# Patient Record
Sex: Male | Born: 1995 | Race: Black or African American | Hispanic: No | Marital: Single | Smoking: Current some day smoker
Health system: Southern US, Community
[De-identification: ages and names within clinical notes are randomized; demographics above are authoritative.]

## PROBLEM LIST (undated history)

## (undated) DIAGNOSIS — J302 Other seasonal allergic rhinitis: Secondary | ICD-10-CM

---
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
## 2015-12-29 ENCOUNTER — Encounter (HOSPITAL_COMMUNITY): Payer: Self-pay | Admitting: Emergency Medicine

## 2015-12-29 ENCOUNTER — Emergency Department (HOSPITAL_COMMUNITY)
Admission: EM | Admit: 2015-12-29 | Discharge: 2015-12-29 | Disposition: A | Discharge status: Home / Self Care | Payer: Medicaid Other | Attending: Emergency Medicine | Admitting: Emergency Medicine

## 2015-12-29 DIAGNOSIS — L509 Urticaria, unspecified: Secondary | ICD-10-CM | POA: Insufficient documentation

## 2015-12-29 DIAGNOSIS — F17200 Nicotine dependence, unspecified, uncomplicated: Secondary | ICD-10-CM | POA: Insufficient documentation

## 2015-12-29 NOTE — ED Notes (Signed)
Called pt in lobby without response 

## 2015-12-29 NOTE — ED Notes (Signed)
Pt. reports itchy skin hives onset this morning ay neck , arms and left knee , airway intact /respirations unlabored , no oral swelling .

## 2015-12-29 NOTE — ED Notes (Signed)
Called for room X3. No response.  

## 2015-12-30 ENCOUNTER — Encounter (HOSPITAL_COMMUNITY): Payer: Self-pay | Admitting: Emergency Medicine

## 2015-12-30 ENCOUNTER — Emergency Department (HOSPITAL_COMMUNITY)
Admission: EM | Admit: 2015-12-30 | Discharge: 2015-12-30 | Disposition: A | Discharge status: Home / Self Care | Payer: Medicaid Other | Attending: Emergency Medicine | Admitting: Emergency Medicine

## 2015-12-30 DIAGNOSIS — Z7952 Long term (current) use of systemic steroids: Secondary | ICD-10-CM | POA: Insufficient documentation

## 2015-12-30 DIAGNOSIS — Z79899 Other long term (current) drug therapy: Secondary | ICD-10-CM | POA: Insufficient documentation

## 2015-12-30 DIAGNOSIS — L03114 Cellulitis of left upper limb: Secondary | ICD-10-CM | POA: Insufficient documentation

## 2015-12-30 DIAGNOSIS — Z792 Long term (current) use of antibiotics: Secondary | ICD-10-CM | POA: Insufficient documentation

## 2015-12-30 DIAGNOSIS — F17200 Nicotine dependence, unspecified, uncomplicated: Secondary | ICD-10-CM | POA: Insufficient documentation

## 2015-12-30 DIAGNOSIS — Z7951 Long term (current) use of inhaled steroids: Secondary | ICD-10-CM | POA: Insufficient documentation

## 2015-12-30 MED ORDER — CEPHALEXIN 500 MG PO CAPS: 500.0000 mg | ORAL_CAPSULE | Freq: Four times a day (QID) | ORAL | Status: DC

## 2015-12-30 MED ORDER — SULFAMETHOXAZOLE-TRIMETHOPRIM 800-160 MG PO TABS: 2.0000 | ORAL_TABLET | Freq: Two times a day (BID) | ORAL | Status: AC

## 2015-12-30 NOTE — ED Provider Notes (Signed)
CSN: [id number]     Arrival date & time 12/30/15  1015 History  By signing my name below, I, Placido Sou, attest that this documentation has been prepared under the direction and in the presence of [employer], PA-C. Electronically Signed: Placido Sou, ED Scribe. 12/30/2015. 11:03 AM.   Chief Complaint  Patient presents with  . Insect Bite   The history is provided by the patient. No language interpreter was used.   HPI Comments: Arthur Lee is a 20 y.o. male who presents to the Emergency Department complaining of worsening, diffuse, moderate, swelling and redness to his left forearm x 1 day. Pt states that he woke with what appeared to be a rash yesterday morning that has progressively worsened. He believes it was an insect bite due to a small wound at the center of the affected region but does not remember an insect biting the region or having visualized an insect prior to his symptoms. He denies a PMHx of DM. Pt denies fevers, chills, n/v or any other associated symptoms at this time.   No past medical history on file. No past surgical history on file. No family history on file. Social History  Substance Use Topics  . Smoking status: Current Every Day Smoker  . Smokeless tobacco: Not on file  . Alcohol Use: No    Review of Systems  Constitutional: Negative for fever and chills.  Gastrointestinal: Negative for nausea and vomiting.  Skin: Positive for color change and rash.    Allergies  Review of patient's allergies indicates no known allergies.  Home Medications   Prior to Admission medications   Medication Sig Start Date End Date Taking? Authorizing Provider  cetirizine (ZYRTEC) 10 MG tablet TAKE 1 TABLET (10 MG TOTAL) BY MOUTH DAILY. 07/29/14   Twana First Hess, DO  fluticasone (FLONASE) 50 MCG/ACT nasal spray USE 2 SPRAYS IN EACH NOSTRIL EVERY DAY 07/29/14   Briscoe Deutscher, DO  hydrocortisone 2.5 % cream Apply topically 3 (three) times daily. 11/10/13   Lowanda Foster, NP  mupirocin ointment (BACTROBAN) 2 % Apply 1 application topically 3 (three) times daily. 11/10/13   Lowanda Foster, NP  polyethylene glycol powder (GLYCOLAX/MIRALAX) powder Take 17 g by mouth 2 (two) times daily as needed. 09/18/13   Uvaldo Rising, MD  PROAIR HFA 108 (90 BASE) MCG/ACT inhaler INHALE 2 PUFFS INTO THE LUNGS EVERY 6 (SIX) HOURS AS NEEDED FOR WHEEZING OR SHORTNESS OF BREATH. 07/29/14   Briscoe Deutscher, DO  ranitidine (ZANTAC) 150 MG tablet Take 1 tablet (150 mg total) by mouth 2 (two) times daily. 07/11/13   Lonia Skinner, MD   BP 110/66 mmHg  Pulse 78  Temp(Src) 98.4 F (36.9 C) (Oral)  Resp 18  Ht  (1.803 m)  Wt 165 lb (74.844 kg)  BMI 23.02 kg/m2  SpO2 100%    Physical Exam  Constitutional: He is oriented to person, place, and time. He appears well-developed and well-nourished.  HENT:  Head: Normocephalic and atraumatic.  Eyes: EOM are normal.  Neck: Normal range of motion.  Cardiovascular: Normal rate and regular rhythm.   Pulses:      Radial pulses are 2+ on the left side.  Pulmonary/Chest: Effort normal and breath sounds normal. No respiratory distress.  Abdominal: Soft.  Musculoskeletal: Normal range of motion.  erythematous area to the dorsal aspect of the left forearm that extends to the volar aspect of the left forearm; No fluctuance.  No erythematous streaking.  FROM  of the left wrist and elbow  Neurological: He is alert and oriented to person, place, and time.  Distal sensation of the left hand is intact  Skin: Skin is warm and dry. There is erythema.  Psychiatric: He has a normal mood and affect.  Nursing note and vitals reviewed.   ED Course  Procedures  DIAGNOSTIC STUDIES: Oxygen Saturation is 100% on RA, normal by my interpretation.    COORDINATION OF CARE: 11:00 AM Discussed next steps with pt including an US of the left forearm. He verbalized understanding and is agreeable with the plan.   Labs Review Labs Reviewed - No data to  display  Imaging Review No results found. I have personally reviewed and evaluated these images as part of my medical decision-making.   EKG Interpretation None      MDM   Final diagnoses:  None  Patient presents today with a cellulitis of the left forearm.  No fluctuance present.  Evaluated the patient with a bedside ultrasound and did not see any fluid collection present.  Patient is afebrile without systemic symptoms.  He is not immunocompromised.  Feel that the patient can be treated with oral antibiotics.  Patient instructed to follow up in 2 days to have the area rechecked.  Strict return precautions given.    I personally performed the services described in this documentation, which was scribed in my presence. The recorded information has been reviewed and is accurate.    Santiago GladHeather Rondia Higginbotham, PA-C 12/31/15 2136  Leta BaptistEmily Roe Nguyen, MD 01/11/16 [phone]

## 2015-12-30 NOTE — ED Notes (Signed)
Pt woke yesterday am with "insect bite" to left forearm. Today, has redness and swelling. According to pt, "much more" than yesterday.

## 2016-10-25 ENCOUNTER — Encounter (HOSPITAL_COMMUNITY): Payer: Self-pay | Admitting: Emergency Medicine

## 2016-10-25 ENCOUNTER — Emergency Department (HOSPITAL_COMMUNITY)
Admission: EM | Admit: 2016-10-25 | Discharge: 2016-10-25 | Disposition: A | Discharge status: Home / Self Care | Payer: Medicaid Other | Attending: Emergency Medicine | Admitting: Emergency Medicine

## 2016-10-25 ENCOUNTER — Emergency Department (HOSPITAL_COMMUNITY): Payer: Medicaid Other

## 2016-10-25 DIAGNOSIS — F17200 Nicotine dependence, unspecified, uncomplicated: Secondary | ICD-10-CM | POA: Insufficient documentation

## 2016-10-25 DIAGNOSIS — R110 Nausea: Secondary | ICD-10-CM | POA: Insufficient documentation

## 2016-10-25 DIAGNOSIS — R51 Headache: Secondary | ICD-10-CM | POA: Insufficient documentation

## 2016-10-25 DIAGNOSIS — R6889 Other general symptoms and signs: Secondary | ICD-10-CM

## 2016-10-25 DIAGNOSIS — J3489 Other specified disorders of nose and nasal sinuses: Secondary | ICD-10-CM | POA: Insufficient documentation

## 2016-10-25 DIAGNOSIS — Z79899 Other long term (current) drug therapy: Secondary | ICD-10-CM | POA: Insufficient documentation

## 2016-10-25 DIAGNOSIS — R05 Cough: Secondary | ICD-10-CM | POA: Insufficient documentation

## 2016-10-25 DIAGNOSIS — M791 Myalgia: Secondary | ICD-10-CM | POA: Insufficient documentation

## 2016-10-25 DIAGNOSIS — R509 Fever, unspecified: Secondary | ICD-10-CM | POA: Insufficient documentation

## 2016-10-25 DIAGNOSIS — R040 Epistaxis: Secondary | ICD-10-CM | POA: Insufficient documentation

## 2016-10-25 MED ORDER — ACETAMINOPHEN 325 MG PO TABS
650.0000 mg | ORAL_TABLET | Freq: Once | ORAL | Status: AC | PRN
  Administered 2016-10-25: 650 mg via ORAL
  Filled 2016-10-25: qty 2

## 2016-10-25 MED ORDER — IBUPROFEN 400 MG PO TABS
600.0000 mg | ORAL_TABLET | Freq: Once | ORAL | Status: AC
  Administered 2016-10-25: 600 mg via ORAL
  Filled 2016-10-25: qty 2

## 2016-10-25 NOTE — ED Triage Notes (Signed)
Pt c/o fever, cough and nose bleeds x one week.

## 2016-10-25 NOTE — ED Notes (Signed)
Pt states understanding of care given and follow up instructions.  Pt a/o, laughing, ambulated from ED with steady gait.  Sister to transport home

## 2016-10-25 NOTE — ED Provider Notes (Signed)
[facility]-EMERGENCY DEPT Provider Note   CSN: [id number] Arrival date & time: 10/25/16  2035  By signing my name below, I, Nelwyn Salisbury, attest that this documentation has been prepared under the direction and in the presence of Raeford Razor, MD . Electronically Signed: Nelwyn Salisbury, Scribe. 10/25/2016. 9:25 PM.  History   Chief Complaint Chief Complaint  Patient presents with  . Fever   The history is provided by the patient. No language interpreter was used.    HPI Comments:  MELQUAN ERNSBERGER is an otherwise healthy 21 y.o. male who presents to the Emergency Department complaining of constant, waxing/waning fever beginning about 1 week ago. Pt has a fever of 101.5 here in the ED. Pt reports associated rhinorrhea, chills, cough, nausea, epistaxis, diffuse myalgias and headache. He has tried OTC decongestants and ibuprofen with minimal relief. Pt's p/o intake has decreased but he is still drinking fluids. Pt denies any sore throat, difficulty urinating, rash or joint swelling.  History reviewed. No pertinent past medical history.  Patient Active Problem List   Diagnosis Date Noted  . Abdominal pain, left upper quadrant 09/18/2013  . Headache(784.0) 07/11/2013  . Cough 07/02/2013  . Excessive cerumen in left ear canal 07/02/2013  . Sports physical 09/26/2012  . Allergic rhinitis 11/09/2011    History reviewed. No pertinent surgical history.     Home Medications    Prior to Admission medications   Medication Sig Start Date End Date Taking? Authorizing Provider  cephALEXin (KEFLEX) 500 MG capsule Take 1 capsule (500 mg total) by mouth 4 (four) times daily. 12/30/15   Heather Laisure, PA-C  cetirizine (ZYRTEC) 10 MG tablet TAKE 1 TABLET (10 MG TOTAL) BY MOUTH DAILY. 07/29/14   Twana First Hess, DO  fluticasone (FLONASE) 50 MCG/ACT nasal spray USE 2 SPRAYS IN EACH NOSTRIL EVERY DAY 07/29/14   Briscoe Deutscher, DO  hydrocortisone 2.5 % cream Apply topically 3 (three) times daily. 11/10/13    Lowanda Foster, NP  mupirocin ointment (BACTROBAN) 2 % Apply 1 application topically 3 (three) times daily. 11/10/13   Lowanda Foster, NP  polyethylene glycol powder (GLYCOLAX/MIRALAX) powder Take 17 g by mouth 2 (two) times daily as needed. 09/18/13   Uvaldo Rising, MD  PROAIR HFA 108 (90 BASE) MCG/ACT inhaler INHALE 2 PUFFS INTO THE LUNGS EVERY 6 (SIX) HOURS AS NEEDED FOR WHEEZING OR SHORTNESS OF BREATH. 07/29/14   Briscoe Deutscher, DO  ranitidine (ZANTAC) 150 MG tablet Take 1 tablet (150 mg total) by mouth 2 (two) times daily. 07/11/13   Lonia Skinner, MD    Family History No family history on file.  Social History Social History  Substance Use Topics  . Smoking status: Current Every Day Smoker  . Smokeless tobacco: Never Used  . Alcohol use No     Allergies   Patient has no known allergies.   Review of Systems Review of Systems  Constitutional: Positive for chills and fever.  HENT: Positive for nosebleeds and rhinorrhea. Negative for sore throat.   Respiratory: Positive for cough.   Gastrointestinal: Positive for nausea.  Genitourinary: Negative for difficulty urinating.  Musculoskeletal: Positive for myalgias. Negative for joint swelling.  Skin: Negative for rash.  Neurological: Positive for headaches.  All other systems reviewed and are negative.    Physical Exam Updated Vital Signs BP 128/70   Pulse 105   Temp 101.5 F (38.6 C)   Resp 20   Ht 5\' 10"  (1.778 m)   Wt 164 lb (74.4 kg)  SpO2 98%   BMI 23.53 kg/m   Physical Exam  Constitutional: He is oriented to person, place, and time. He appears well-developed and well-nourished.  HENT:  Head: Normocephalic and atraumatic.  Eyes: EOM are normal.  Neck: Normal range of motion.  Cardiovascular: Normal rate, regular rhythm, normal heart sounds and intact distal pulses.   Pulmonary/Chest: Effort normal and breath sounds normal. No respiratory distress.  Abdominal: Soft. He exhibits no distension. There is no  tenderness.  Musculoskeletal: Normal range of motion.  Neurological: He is alert and oriented to person, place, and time.  Skin: Skin is warm and dry.  Psychiatric: He has a normal mood and affect. Judgment normal.  Nursing note and vitals reviewed.    ED Treatments / Results  DIAGNOSTIC STUDIES:  Oxygen Saturation is 98% on RA, normal by my interpretation.    COORDINATION OF CARE:  9:39 PM Discussed treatment plan with pt at bedside which includes symptomatic management with otc NSAIDs and maintaining fluid intake and pt agreed to plan.  Appears tired but not toxic. Pt is joking around in room.   Labs (all labs ordered are listed, but only abnormal results are displayed) Labs Reviewed - No data to display  EKG  EKG Interpretation None       Radiology Dg Chest 2 View  Result Date: 10/25/2016 CLINICAL DATA:  21 y/o  M; fever, cough, nosebleeds. EXAM: CHEST  2 VIEW COMPARISON:  09/10/2012 chest radiograph. FINDINGS: Stable heart size and mediastinal contours are within normal limits. Both lungs are clear. The visualized skeletal structures are unremarkable. IMPRESSION: No active cardiopulmonary disease. Electronically Signed   By: Mitzi HansenLance  Furusawa-Stratton M.D.   On: 10/25/2016 21:12    Procedures Procedures (including critical care time)  Medications Ordered in ED Medications  acetaminophen (TYLENOL) tablet 650 mg (650 mg Oral Given 10/25/16 2057)     Initial Impression / Assessment and Plan / ED Course  I have reviewed the triage vital signs and the nursing notes.  Pertinent labs & imaging results that were available during my care of the patient were reviewed by me and considered in my medical decision making (see chart for details).      Final Clinical Impressions(s) / ED Diagnoses   Final diagnoses:  Flu-like symptoms    New Prescriptions New Prescriptions   No medications on file    I personally preformed the services scribed in my presence. The  recorded information has been reviewed is accurate. Raeford RazorStephen Latricia Cerrito, MD.     Raeford RazorStephen Ryann Pauli, MD 11/02/16 [phone]

## 2017-03-26 ENCOUNTER — Emergency Department (HOSPITAL_COMMUNITY): Payer: Self-pay

## 2017-03-26 ENCOUNTER — Encounter (HOSPITAL_COMMUNITY): Payer: Self-pay

## 2017-03-26 ENCOUNTER — Emergency Department (HOSPITAL_COMMUNITY)
Admission: EM | Admit: 2017-03-26 | Discharge: 2017-03-27 | Disposition: A | Discharge status: Home / Self Care | Payer: Self-pay | Attending: Emergency Medicine | Admitting: Emergency Medicine

## 2017-03-26 DIAGNOSIS — F17200 Nicotine dependence, unspecified, uncomplicated: Secondary | ICD-10-CM | POA: Insufficient documentation

## 2017-03-26 DIAGNOSIS — M79672 Pain in left foot: Secondary | ICD-10-CM | POA: Insufficient documentation

## 2017-03-26 NOTE — ED Triage Notes (Signed)
Onset yesterday morning upon awakenking, left lateral side of foot pain.  No known injury.  Unable to bear full weight.

## 2017-03-26 NOTE — ED Provider Notes (Signed)
[facility]-EMERGENCY DEPT Provider Note   CSN: [id number] Arrival date & time: 03/26/17  2146   By signing my name below, I, Clarisse Gouge, attest that this documentation has been prepared under the direction and in the presence of Sharilyn Sites, PA-C. Electronically signed, Clarisse Gouge, ED Scribe. 03/26/17. 11:41 PM.   History   Chief Complaint Chief Complaint  Patient presents with  . Foot Pain   The history is provided by the patient and medical records. No language interpreter was used.    Arthur Lee is a 21 y.o. male presenting to the Emergency Department concerning new onset L foot pain since yesterday on waking. Pt states he cannot walk on the foot d/t pain. Pt describes constant, gradually worsening aches worse with weight bearing and ambulation. No PTA medications. He states he recently began wearing new shoes; he also states he stands for extended periods of time at work. No known injury. No swelling or color changes. No other complaints at this time.   History reviewed. No pertinent past medical history.  Patient Active Problem List   Diagnosis Date Noted  . Abdominal pain, left upper quadrant 09/18/2013  . Headache(784.0) 07/11/2013  . Cough 07/02/2013  . Excessive cerumen in left ear canal 07/02/2013  . Sports physical 09/26/2012  . Allergic rhinitis 11/09/2011    History reviewed. No pertinent surgical history.     Home Medications    Prior to Admission medications   Medication Sig Start Date End Date Taking? Authorizing Provider  cephALEXin (KEFLEX) 500 MG capsule Take 1 capsule (500 mg total) by mouth 4 (four) times daily. 12/30/15   Santiago Glad, PA-C  cetirizine (ZYRTEC) 10 MG tablet TAKE 1 TABLET (10 MG TOTAL) BY MOUTH DAILY. 07/29/14   Hess, Twana First, DO  fluticasone (FLONASE) 50 MCG/ACT nasal spray USE 2 SPRAYS IN EACH NOSTRIL EVERY DAY 07/29/14   Hess, Judie Grieve R, DO  hydrocortisone 2.5 % cream Apply topically 3 (three) times daily. 11/10/13   Lowanda Foster, NP  mupirocin ointment (BACTROBAN) 2 % Apply 1 application topically 3 (three) times daily. 11/10/13   Lowanda Foster, NP  polyethylene glycol powder (GLYCOLAX/MIRALAX) powder Take 17 g by mouth 2 (two) times daily as needed. 09/18/13   Uvaldo Rising, MD  PROAIR HFA 108 (90 BASE) MCG/ACT inhaler INHALE 2 PUFFS INTO THE LUNGS EVERY 6 (SIX) HOURS AS NEEDED FOR WHEEZING OR SHORTNESS OF BREATH. 07/29/14   Hess, Twana First, DO  ranitidine (ZANTAC) 150 MG tablet Take 1 tablet (150 mg total) by mouth 2 (two) times daily. 07/11/13   Losq, Leafy Kindle, MD    Family History History reviewed. No pertinent family history.  Social History Social History  Substance Use Topics  . Smoking status: Current Every Day Smoker  . Smokeless tobacco: Never Used  . Alcohol use No     Allergies   Patient has no known allergies.   Review of Systems Review of Systems  Musculoskeletal: Positive for arthralgias and gait problem. Negative for joint swelling.  Skin: Negative for color change and wound.  Neurological: Negative for weakness and numbness.  All other systems reviewed and are negative.    Physical Exam Updated Vital Signs BP 116/79   Pulse 78   Temp 98.3 F (36.8 C) (Oral)   Resp 16   SpO2 99%   Physical Exam  Constitutional: He is oriented to person, place, and time. He appears well-developed and well-nourished.  HENT:  Head: Normocephalic and atraumatic.  Mouth/Throat: Oropharynx is  clear and moist.  Eyes: Pupils are equal, round, and reactive to light. Conjunctivae and EOM are normal.  Neck: Normal range of motion.  Cardiovascular: Normal rate, regular rhythm and normal heart sounds.   Pulmonary/Chest: Effort normal and breath sounds normal.  Abdominal: Soft. Bowel sounds are normal.  Musculoskeletal: Normal range of motion.  Tenderness along the lateral aspect of left foot, there is some mild swelling but no acute bony deformity noted, skin is intact without wounds, erythema, or  warmth to touch, DP pulse intact, moving all toes normally  Neurological: He is alert and oriented to person, place, and time.  Skin: Skin is warm and dry.  Psychiatric: He has a normal mood and affect.  Nursing note and vitals reviewed.    ED Treatments / Results  DIAGNOSTIC STUDIES: Oxygen Saturation is 99% on RA, NL by my interpretation.    COORDINATION OF CARE: 11:21 PM-Discussed next steps with pt. Pt verbalized understanding and is agreeable with the plan. Will Rx medications. Pt prepared for d/c, advised of symptomatic care at home, F/U instructions and return precautions.    Labs (all labs ordered are listed, but only abnormal results are displayed) Labs Reviewed - No data to display  EKG  EKG Interpretation None       Radiology Dg Foot Complete Left  Result Date: 03/26/2017 CLINICAL DATA:  Left lateral foot pain x2 days. EXAM: LEFT FOOT - COMPLETE 3+ VIEW COMPARISON:  None. FINDINGS: There is no evidence of AP fracture or dislocation. Small ossicles are seen adjacent to the cuboid. There is no evidence of arthropathy. Mild soft tissue swelling along the lateral aspect of the mid and forefoot. IMPRESSION: Nonspecific mild soft tissue swelling along the lateral aspect of the mid and forefoot without underlying fracture identified. No joint dislocations. Electronically Signed   By: Tollie Ethavid  Kwon M.D.   On: 03/26/2017 23:06    Procedures Procedures (including critical care time)  Medications Ordered in ED Medications - No data to display   Initial Impression / Assessment and Plan / ED Course  I have reviewed the triage vital signs and the nursing notes.  Pertinent labs & imaging results that were available during my care of the patient were reviewed by me and considered in my medical decision making (see chart for details).  21 year old male here with left lateral foot pain. No reported injury or trauma. Does have some tenderness and mild swelling on exam without bony  deformity. Foot is neurovascularly intact. X-ray obtained is negative for acute bony findings. Patient placed in ASO brace. He'll be referred to orthopedics for ongoing management. Discussed anti-inflammatories, ice and elevation at home. Patient discharged home in stable condition.  Final Clinical Impressions(s) / ED Diagnoses   Final diagnoses:  Foot pain, left    New Prescriptions New Prescriptions   IBUPROFEN (ADVIL,MOTRIN) 800 MG TABLET    Take 1 tablet (800 mg total) by mouth 3 (three) times daily.   I personally performed the services described in this documentation, which was scribed in my presence. The recorded information has been reviewed and is accurate.   Garlon HatchetSanders, Kasidi Shanker M, PA-C 03/27/17 Karrie Meres0022    Rees, Elizabeth, MD 03/27/17 [phone]

## 2017-03-27 MED ORDER — IBUPROFEN 800 MG PO TABS: 800.0000 mg | ORAL_TABLET | Freq: Three times a day (TID) | ORAL | 0 refills | Status: DC

## 2017-03-27 NOTE — Discharge Instructions (Signed)
Take the prescribed medication as directed.  Ice and elevate foot at home. Follow-up with orthopedics if you have ongoing issues.  Call to make appt. Return to the ED for new or worsening symptoms.

## 2017-03-28 ENCOUNTER — Encounter (HOSPITAL_COMMUNITY): Payer: Self-pay | Admitting: Emergency Medicine

## 2017-03-28 ENCOUNTER — Emergency Department (HOSPITAL_COMMUNITY)
Admission: EM | Admit: 2017-03-28 | Discharge: 2017-03-28 | Disposition: A | Discharge status: Home / Self Care | Payer: Self-pay | Attending: Emergency Medicine | Admitting: Emergency Medicine

## 2017-03-28 DIAGNOSIS — Z79899 Other long term (current) drug therapy: Secondary | ICD-10-CM | POA: Insufficient documentation

## 2017-03-28 DIAGNOSIS — F17200 Nicotine dependence, unspecified, uncomplicated: Secondary | ICD-10-CM | POA: Insufficient documentation

## 2017-03-28 DIAGNOSIS — M79672 Pain in left foot: Secondary | ICD-10-CM | POA: Insufficient documentation

## 2017-03-28 NOTE — ED Triage Notes (Signed)
Pt seen at [facility] 2 days ago for same. Had xrays and was negative per pt. Pt states still swelling and pain with walking. Taking motrin with some relief.

## 2017-03-29 NOTE — ED Provider Notes (Signed)
[facility]-EMERGENCY DEPT Provider Note   CSN: [id number] Arrival date & time: 03/28/17  1357     History   Chief Complaint Chief Complaint  Patient presents with  . Foot Pain    HPI Arthur Lee is a 21 y.o. male.  The history is provided by the patient. No language interpreter was used.  Foot Pain  This is a recurrent problem. The current episode started more than 2 days ago. The problem occurs constantly. Nothing aggravates the symptoms. Nothing relieves the symptoms. He has tried nothing for the symptoms. The treatment provided no relief.  Pt complains of pain in his foot.  Pt reports pain is on the lateral part of his left foot.  History reviewed. No pertinent past medical history.  Patient Active Problem List   Diagnosis Date Noted  . Abdominal pain, left upper quadrant 09/18/2013  . Headache(784.0) 07/11/2013  . Cough 07/02/2013  . Excessive cerumen in left ear canal 07/02/2013  . Sports physical 09/26/2012  . Allergic rhinitis 11/09/2011    History reviewed. No pertinent surgical history.     Home Medications    Prior to Admission medications   Medication Sig Start Date End Date Taking? Authorizing Provider  cephALEXin (KEFLEX) 500 MG capsule Take 1 capsule (500 mg total) by mouth 4 (four) times daily. 12/30/15   Santiago GladLaisure, Heather, PA-C  cetirizine (ZYRTEC) 10 MG tablet TAKE 1 TABLET (10 MG TOTAL) BY MOUTH DAILY. 07/29/14   Hess, Twana FirstBryan R, DO  fluticasone (FLONASE) 50 MCG/ACT nasal spray USE 2 SPRAYS IN EACH NOSTRIL EVERY DAY 07/29/14   Hess, Judie GrieveBryan R, DO  hydrocortisone 2.5 % cream Apply topically 3 (three) times daily. 11/10/13   Lowanda FosterBrewer, Mindy, NP  ibuprofen (ADVIL,MOTRIN) 800 MG tablet Take 1 tablet (800 mg total) by mouth 3 (three) times daily. 03/27/17   Garlon HatchetSanders, Lisa M, PA-C  mupirocin ointment (BACTROBAN) 2 % Apply 1 application topically 3 (three) times daily. 11/10/13   Lowanda FosterBrewer, Mindy, NP  polyethylene glycol powder (GLYCOLAX/MIRALAX) powder Take 17 g by mouth  2 (two) times daily as needed. 09/18/13   Uvaldo RisingFletke, Kyle J, MD  PROAIR HFA 108 (90 BASE) MCG/ACT inhaler INHALE 2 PUFFS INTO THE LUNGS EVERY 6 (SIX) HOURS AS NEEDED FOR WHEEZING OR SHORTNESS OF BREATH. 07/29/14   Hess, Twana FirstBryan R, DO  ranitidine (ZANTAC) 150 MG tablet Take 1 tablet (150 mg total) by mouth 2 (two) times daily. 07/11/13   Losq, Leafy KindleStephanie E, MD    Family History History reviewed. No pertinent family history.  Social History Social History  Substance Use Topics  . Smoking status: Current Some Day Smoker  . Smokeless tobacco: Never Used  . Alcohol use No     Allergies   Patient has no known allergies.   Review of Systems Review of Systems  Musculoskeletal: Negative for back pain and joint swelling.  All other systems reviewed and are negative.    Physical Exam Updated Vital Signs BP 119/81 (BP Location: Right Arm)   Pulse (!) 107   Temp 98.7 F (37.1 C) (Oral)   Resp 17   Ht 5\' 10"  (1.778 m)   Wt 78.9 kg (174 lb)   SpO2 98%   BMI 24.97 kg/m   Physical Exam  Constitutional: He appears well-developed and well-nourished.  HENT:  Head: Normocephalic.  Musculoskeletal: He exhibits tenderness.  Swollen left foot,  Tender to left lateral foot,  Pain with range of motion,  nv and ns intact   Neurological: He is alert.  Skin: Skin is warm.  Psychiatric: He has a normal mood and affect.  Nursing note and vitals reviewed.    ED Treatments / Results  Labs (all labs ordered are listed, but only abnormal results are displayed) Labs Reviewed - No data to display  EKG  EKG Interpretation None       Radiology No results found.  Procedures Procedures (including critical care time)  Medications Ordered in ED Medications - No data to display   Initial Impression / Assessment and Plan / ED Course  I have reviewed the triage vital signs and the nursing notes.  Pertinent labs & imaging results that were available during my care of the patient were reviewed  by me and considered in my medical decision making (see chart for details).     Pt placed in a cam walker and crutches,  Pt given note for his job for light duty.  Final Clinical Impressions(s) / ED Diagnoses   Final diagnoses:  Foot pain, left    New Prescriptions Discharge Medication List as of 03/28/2017  3:17 PM     An After Visit Summary was printed and given to the patient. Follow up with Dr. Romeo Apple for evaluation   Elson Areas, PA-C 03/29/17 0820    Long, Arlyss Repress, MD 03/29/17 1125

## 2017-12-16 ENCOUNTER — Other Ambulatory Visit: Payer: Self-pay

## 2017-12-16 ENCOUNTER — Encounter (HOSPITAL_COMMUNITY): Payer: Self-pay | Admitting: Emergency Medicine

## 2017-12-16 ENCOUNTER — Emergency Department (HOSPITAL_COMMUNITY)
Admission: EM | Admit: 2017-12-16 | Discharge: 2017-12-16 | Disposition: A | Discharge status: Home / Self Care | Payer: Self-pay | Attending: Emergency Medicine | Admitting: Emergency Medicine

## 2017-12-16 DIAGNOSIS — F17210 Nicotine dependence, cigarettes, uncomplicated: Secondary | ICD-10-CM | POA: Insufficient documentation

## 2017-12-16 DIAGNOSIS — Z202 Contact with and (suspected) exposure to infections with a predominantly sexual mode of transmission: Secondary | ICD-10-CM | POA: Insufficient documentation

## 2017-12-16 DIAGNOSIS — Z79899 Other long term (current) drug therapy: Secondary | ICD-10-CM | POA: Insufficient documentation

## 2017-12-16 LAB — URINALYSIS, ROUTINE W REFLEX MICROSCOPIC
BILIRUBIN URINE: NEGATIVE
GLUCOSE, UA: NEGATIVE mg/dL
HGB URINE DIPSTICK: NEGATIVE
KETONES UR: NEGATIVE mg/dL
Leukocytes, UA: NEGATIVE
NITRITE: NEGATIVE
PH: 6 (ref 5.0–8.0)
Protein, ur: NEGATIVE mg/dL
SPECIFIC GRAVITY, URINE: 1.025 (ref 1.005–1.030)

## 2017-12-16 MED ORDER — CEFTRIAXONE SODIUM 250 MG IJ SOLR
250.0000 mg | Freq: Once | INTRAMUSCULAR | Status: AC
  Administered 2017-12-16: 250 mg via INTRAMUSCULAR
  Filled 2017-12-16: qty 250

## 2017-12-16 MED ORDER — AZITHROMYCIN 250 MG PO TABS
1000.0000 mg | ORAL_TABLET | Freq: Once | ORAL | Status: AC
  Administered 2017-12-16: 1000 mg via ORAL
  Filled 2017-12-16: qty 4

## 2017-12-16 MED ORDER — LIDOCAINE HCL (PF) 1 % IJ SOLN
INTRAMUSCULAR | Status: AC
  Administered 2017-12-16: 0.9 mL
  Filled 2017-12-16: qty 2

## 2017-12-16 NOTE — ED Triage Notes (Signed)
Patient requesting STD check. Patient contacted by partner and was told she was positive for chlamydia in which she was treated. Patient denies any symptoms.

## 2017-12-16 NOTE — Discharge Instructions (Addendum)
You have been treated today for gonorrhea and Chlamydia.  You will be notified of any positive test results.

## 2017-12-16 NOTE — ED Provider Notes (Signed)
[facility] EMERGENCY DEPARTMENT Provider Note   CSN: [id number] Arrival date & time: 12/16/17  1235     History   Chief Complaint Chief Complaint  Patient presents with  . Exposure to STD    HPI DARELD MCAULIFFE is a 22 y.o. male.  HPI   SHAMEEK NYQUIST is a 22 y.o. male who presents to the Emergency Department for evaluation of a possible STD.  States that he was recently contacted by his significant other stating that she was treated for chlamydia.  Patient states that his last sexual encounter was over 9 months ago.  He denies any symptoms including rash, genital lesions, penile pain, swelling or discharge, abdominal pain, urinary symptoms or fever   History reviewed. No pertinent past medical history.  Patient Active Problem List   Diagnosis Date Noted  . Abdominal pain, left upper quadrant 09/18/2013  . Headache(784.0) 07/11/2013  . Cough 07/02/2013  . Excessive cerumen in left ear canal 07/02/2013  . Sports physical 09/26/2012  . Allergic rhinitis 11/09/2011    History reviewed. No pertinent surgical history.    Home Medications    Prior to Admission medications   Medication Sig Start Date End Date Taking? Authorizing Provider  cephALEXin (KEFLEX) 500 MG capsule Take 1 capsule (500 mg total) by mouth 4 (four) times daily. 12/30/15   Santiago Glad, PA-C  cetirizine (ZYRTEC) 10 MG tablet TAKE 1 TABLET (10 MG TOTAL) BY MOUTH DAILY. 07/29/14   Hess, Twana First, DO  fluticasone (FLONASE) 50 MCG/ACT nasal spray USE 2 SPRAYS IN EACH NOSTRIL EVERY DAY 07/29/14   Hess, Judie Grieve R, DO  hydrocortisone 2.5 % cream Apply topically 3 (three) times daily. 11/10/13   Lowanda Foster, NP  ibuprofen (ADVIL,MOTRIN) 800 MG tablet Take 1 tablet (800 mg total) by mouth 3 (three) times daily. 03/27/17   Garlon Hatchet, PA-C  mupirocin ointment (BACTROBAN) 2 % Apply 1 application topically 3 (three) times daily. 11/10/13   Lowanda Foster, NP  polyethylene glycol powder (GLYCOLAX/MIRALAX) powder  Take 17 g by mouth 2 (two) times daily as needed. 09/18/13   Uvaldo Rising, MD  PROAIR HFA 108 (90 BASE) MCG/ACT inhaler INHALE 2 PUFFS INTO THE LUNGS EVERY 6 (SIX) HOURS AS NEEDED FOR WHEEZING OR SHORTNESS OF BREATH. 07/29/14   Hess, Twana First, DO  ranitidine (ZANTAC) 150 MG tablet Take 1 tablet (150 mg total) by mouth 2 (two) times daily. 07/11/13   Losq, Leafy Kindle, MD    Family History No family history on file.  Social History Social History   Tobacco Use  . Smoking status: Current Some Day Smoker    Types: Cigarettes  . Smokeless tobacco: Never Used  Substance Use Topics  . Alcohol use: No  . Drug use: No     Allergies   Patient has no known allergies.   Review of Systems Review of Systems  Constitutional: Negative for activity change, appetite change, chills and fever.  Gastrointestinal: Negative for abdominal pain, nausea and vomiting.  Genitourinary: Negative for decreased urine volume, difficulty urinating, discharge, dysuria, flank pain, hematuria, penile pain, penile swelling, scrotal swelling and urgency.  Musculoskeletal: Negative for back pain.  Skin: Negative for rash.  Neurological: Negative for dizziness, weakness and numbness.  Hematological: Negative for adenopathy.  All other systems reviewed and are negative.    Physical Exam Updated Vital Signs BP 121/89 (BP Location: Right Arm)   Pulse 85   Temp 98.7 F (37.1 C) (Oral)   Resp 17  Ht  (1.803 m)   Wt 79.4 kg (175 lb)   SpO2 97%   BMI 24.41 kg/m   Physical Exam  Constitutional: He is oriented to person, place, and time. He appears well-developed and well-nourished. No distress.  HENT:  Head: Normocephalic.  Mouth/Throat: Oropharynx is clear and moist.  Eyes: Pupils are equal, round, and reactive to light.  Neck: Normal range of motion. Neck supple. No Kernig's sign noted. No thyromegaly present.  Cardiovascular: Normal rate and regular rhythm.  Pulmonary/Chest: Effort normal and  breath sounds normal. He has no wheezes.  Abdominal: Soft. Normal appearance. There is no tenderness. There is no rebound and no guarding.  Musculoskeletal: Normal range of motion.  Neurological: He is alert and oriented to person, place, and time. No sensory deficit.  Skin: Skin is warm and dry. Capillary refill takes less than 2 seconds. No rash noted.  Nursing note and vitals reviewed.    ED Treatments / Results  Labs (all labs ordered are listed, but only abnormal results are displayed) Labs Reviewed  URINALYSIS, ROUTINE W REFLEX MICROSCOPIC  RPR  HIV ANTIBODY (ROUTINE TESTING)  GC/CHLAMYDIA PROBE AMP ([place]) NOT AT [facility]    EKG None  Radiology No results found.  Procedures Procedures (including critical care time)  Medications Ordered in ED Medications  azithromycin (ZITHROMAX) tablet 1,000 mg (1,000 mg Oral Given 12/16/17 1354)  cefTRIAXone (ROCEPHIN) injection 250 mg (250 mg Intramuscular Given 12/16/17 1354)  lidocaine (PF) (XYLOCAINE) 1 % injection (0.9 mLs  Given 12/16/17 1354)     Initial Impression / Assessment and Plan / ED Course  I have reviewed the triage vital signs and the nursing notes.  Pertinent labs & imaging results that were available during my care of the patient were reviewed by me and considered in my medical decision making (see chart for details).     Well-appearing, asymptomatic male presenting for evaluation of STD.  Cultures are pending.  He has been treated with Zithromax and Rocephin.  Final Clinical Impressions(s) / ED Diagnoses   Final diagnoses:  Exposure to STD    ED Discharge Orders    None       Pauline Aus, PA-C 12/16/17 1417    Donnetta Hutching, MD 12/17/17 [phone]

## 2017-12-17 LAB — HIV ANTIBODY (ROUTINE TESTING W REFLEX): HIV SCREEN 4TH GENERATION: NONREACTIVE

## 2017-12-17 LAB — RPR: RPR Ser Ql: NONREACTIVE

## 2017-12-19 LAB — GC/CHLAMYDIA PROBE AMP (~~LOC~~) NOT AT ARMC
Chlamydia: NEGATIVE
Neisseria Gonorrhea: NEGATIVE

## 2018-03-08 ENCOUNTER — Other Ambulatory Visit: Payer: Self-pay

## 2018-03-08 ENCOUNTER — Encounter (HOSPITAL_COMMUNITY): Payer: Self-pay | Admitting: Emergency Medicine

## 2018-03-08 ENCOUNTER — Emergency Department (HOSPITAL_COMMUNITY): Payer: Self-pay

## 2018-03-08 ENCOUNTER — Emergency Department (HOSPITAL_COMMUNITY)
Admission: EM | Admit: 2018-03-08 | Discharge: 2018-03-08 | Disposition: A | Discharge status: Home / Self Care | Payer: Self-pay | Attending: Emergency Medicine | Admitting: Emergency Medicine

## 2018-03-08 DIAGNOSIS — F17210 Nicotine dependence, cigarettes, uncomplicated: Secondary | ICD-10-CM | POA: Insufficient documentation

## 2018-03-08 DIAGNOSIS — Z79899 Other long term (current) drug therapy: Secondary | ICD-10-CM | POA: Insufficient documentation

## 2018-03-08 DIAGNOSIS — M25561 Pain in right knee: Secondary | ICD-10-CM | POA: Insufficient documentation

## 2018-03-08 HISTORY — DX: Other seasonal allergic rhinitis: J30.2

## 2018-03-08 NOTE — ED Provider Notes (Signed)
[facility]ERGENCY DEPART[facility] Provider Note   CSN: [id number] Arrival date & time: 03/08/18  1357     History   Chief Complaint Chief Complaint  Patient presents with  . Knee Pain    HPI Arthur Lee is a 22 y.o. male presents today for evaluation of acute onset, intermittent right knee pain for 2 years.  He states that 2 years ago while he was playing basketball he slipped on some sweat on the ground and landed awkwardly on his left knee.  He states he heard several pops at that time.  Since then he has had intermittent right knee pain primarily on the medial aspect of the right knee.  The pain will at times radiate down the right lower extremity to the ankle.  Pain worsens with exposure to cold and repetitive motion.  He does work a job that is physically demanding and requires a lot of heavy lifting which he notes aggravates his pain.  He states that the pain is experienced almost daily as a result of his job for the past 3 months or so.  He has not tried anything for his symptoms.  Denies numbness, weakness, fevers, chills.  The history is provided by the patient.    Past Medical History:  Diagnosis Date  . Seasonal allergies     Patient Active Problem List   Diagnosis Date Noted  . Abdominal pain, left upper quadrant 09/18/2013  . Headache(784.0) 07/11/2013  . Cough 07/02/2013  . Excessive cerumen in left ear canal 07/02/2013  . Sports physical 09/26/2012  . Allergic rhinitis 11/09/2011    History reviewed. No pertinent surgical history.      Home Medications    Prior to Admission medications   Medication Sig Start Date End Date Taking? Authorizing Provider  cephALEXin (KEFLEX) 500 MG capsule Take 1 capsule (500 mg total) by mouth 4 (four) times daily. 12/30/15   Santiago GladLaisure, Heather, PA-C  cetirizine (ZYRTEC) 10 MG tablet TAKE 1 TABLET (10 MG TOTAL) BY MOUTH DAILY. 07/29/14   Hess, Twana FirstBryan R, DO  fluticasone (FLONASE) 50 MCG/ACT nasal spray USE 2 SPRAYS IN EACH  NOSTRIL EVERY DAY 07/29/14   Hess, Judie GrieveBryan R, DO  hydrocortisone 2.5 % cream Apply topically 3 (three) times daily. 11/10/13   Lowanda FosterBrewer, Mindy, NP  ibuprofen (ADVIL,MOTRIN) 800 MG tablet Take 1 tablet (800 mg total) by mouth 3 (three) times daily. 03/27/17   Garlon HatchetSanders, Lisa M, PA-C  mupirocin ointment (BACTROBAN) 2 % Apply 1 application topically 3 (three) times daily. 11/10/13   Lowanda FosterBrewer, Mindy, NP  polyethylene glycol powder (GLYCOLAX/MIRALAX) powder Take 17 g by mouth 2 (two) times daily as needed. 09/18/13   Uvaldo RisingFletke, Kyle J, MD  PROAIR HFA 108 (90 BASE) MCG/ACT inhaler INHALE 2 PUFFS INTO THE LUNGS EVERY 6 (SIX) HOURS AS NEEDED FOR WHEEZING OR SHORTNESS OF BREATH. 07/29/14   Hess, Twana FirstBryan R, DO  ranitidine (ZANTAC) 150 MG tablet Take 1 tablet (150 mg total) by mouth 2 (two) times daily. 07/11/13   Losq, Leafy KindleStephanie E, MD    Family History History reviewed. No pertinent family history.  Social History Social History   Tobacco Use  . Smoking status: Current Some Day Smoker    Types: Cigarettes  . Smokeless tobacco: Never Used  Substance Use Topics  . Alcohol use: No  . Drug use: Yes    Types: Marijuana    Comment: occasional     Allergies   Patient has no known allergies.   Review of Systems  Review of Systems  Constitutional: Negative for fever.  Musculoskeletal: Positive for arthralgias (R knee).  Neurological: Negative for syncope, weakness and numbness.     Physical Exam Updated Vital Signs BP (!) 96/44 (BP Location: Right Arm)   Pulse 62   Resp 14   Ht 5\' 10"  (1.778 m)   Wt 81.2 kg (179 lb)   SpO2 100%   BMI 25.68 kg/m   Physical Exam  Constitutional: He appears well-developed and well-nourished. No distress.  HENT:  Head: Normocephalic and atraumatic.  Eyes: Conjunctivae are normal. Right eye exhibits no discharge. Left eye exhibits no discharge.  Neck: No JVD present. No tracheal deviation present.  Cardiovascular: Normal rate and intact distal pulses.  2+ DP/PT pulses  bilaterally, Homans sign absent bilaterally, no lower extremity edema  Pulmonary/Chest: Effort normal.  Abdominal: He exhibits no distension.  Musculoskeletal: Normal range of motion. He exhibits tenderness. He exhibits no edema.  Mild tenderness overlying the right medial joint line and medial aspect of the patella.  Normal range of motion actively and passively of the right knee.  No varus or valgus instability, no ligamentous laxity.  Negative anterior/posterior drawer test.  5/5 strength of BLE major muscle groups.  No effusion, crepitus, erythema, or swelling noted.  Neurological: He is alert.  Fluent speech with no evidence of dysarthria or aphasia, no facial droop, sensation intact to soft touch of bilateral lower extreme knees.  Ambulatory with no gait and exhibiting good balance.  Able to Heel Walk and Toe Walk without difficulty.  Skin: Skin is warm and dry. No erythema.  Psychiatric: He has a normal mood and affect. His behavior is normal.  Nursing note and vitals reviewed.    ED Treatments / Results  Labs (all labs ordered are listed, but only abnormal results are displayed) Labs Reviewed - No data to display  EKG None  Radiology Dg Knee Complete 4 Views Right  Result Date: 03/08/2018 CLINICAL DATA:  Chronic knee pain since injury 2 years ago. EXAM: RIGHT KNEE - COMPLETE 4+ VIEW COMPARISON:  None. FINDINGS: No evidence of fracture, dislocation, or joint effusion. No evidence of arthropathy or other focal bone abnormality. Soft tissues are unremarkable. IMPRESSION: Negative. Electronically Signed   By: Obie Dredge M.D.   On: 03/08/2018 14:40    Procedures Procedures (including critical care time)  Medications Ordered in ED Medications - No data to display   Initial Impression / Assessment and Plan / ED Course  I have reviewed the triage vital signs and the nursing notes.  Pertinent labs & imaging results that were available during my care of the patient were  reviewed by me and considered in my medical decision making (see chart for details).     Patient with right knee pains intermittently for the past 2 years after basketball injury.  He is afebrile, vital signs are stable.  He is nontoxic in appearance.  No focal neurologic deficits on examination.  He is ambulatory without difficulty and is able to bear weight on the extremity.  Radiographs show no acute osseous abnormality, no evidence of fracture or dislocation.  Suspect repetitive motion and heavy lifting at work causes flareups of knee pain.  No evidence of DVT. RICE therapy indicated and discussed with patient.  Patient states that he will be eligible for insurance through his employer shortly and is in the process of setting up follow-up with an orthopedist.  He was given a knee sleeve, offered crutches but the patient declined.  Stable  for discharge home with follow-up with PCP or orthopedics for reevaluation.  Discussed strict ED return precautions. Pt verbalized understanding of and agreement with plan and is safe for discharge home at this time.  No complaints prior to discharge. Final Clinical Impressions(s) / ED Diagnoses   Final diagnoses:  Acute pain of right knee    ED Discharge Orders    None       Bennye Alm 03/10/18 1016    Loren Racer, MD 03/11/18 1630

## 2018-03-08 NOTE — Discharge Instructions (Signed)
1. Medications: Alternate 600 mg of ibuprofen and [phone] mg of Tylenol every 3 hours as needed for pain. Do not exceed 4000 mg of Tylenol daily.  Take ibuprofen with food to avoid upset stomach issues. 2. Treatment: rest, ice, elevate and use brace, drink plenty of fluids, gentle stretching (see attached exercises) 3. Follow Up: Please followup with orthopedics as directed or your PCP in 1 week if no improvement for discussion of your diagnoses and further evaluation after today's visit; if you do not have a primary care doctor use the resource guide provided to find one; Please return to the ER for worsening symptoms or other concerns

## 2018-03-08 NOTE — ED Triage Notes (Signed)
Pt reports knee injury two years ago. Pt states has never seen anyone for injury. Pt states needs a work note today because he had to call out of work for knee pain today. Pt reports a lot of standing and twisting required for job.

## 2019-12-29 IMAGING — DX DG KNEE COMPLETE 4+V*R*
4 series · 4 of 4 positions shown · non-contrast
Comparison: None.

CLINICAL DATA: Chronic knee pain since injury 2 years ago.

EXAM:
RIGHT KNEE - COMPLETE 4+ VIEW

[knee ap]
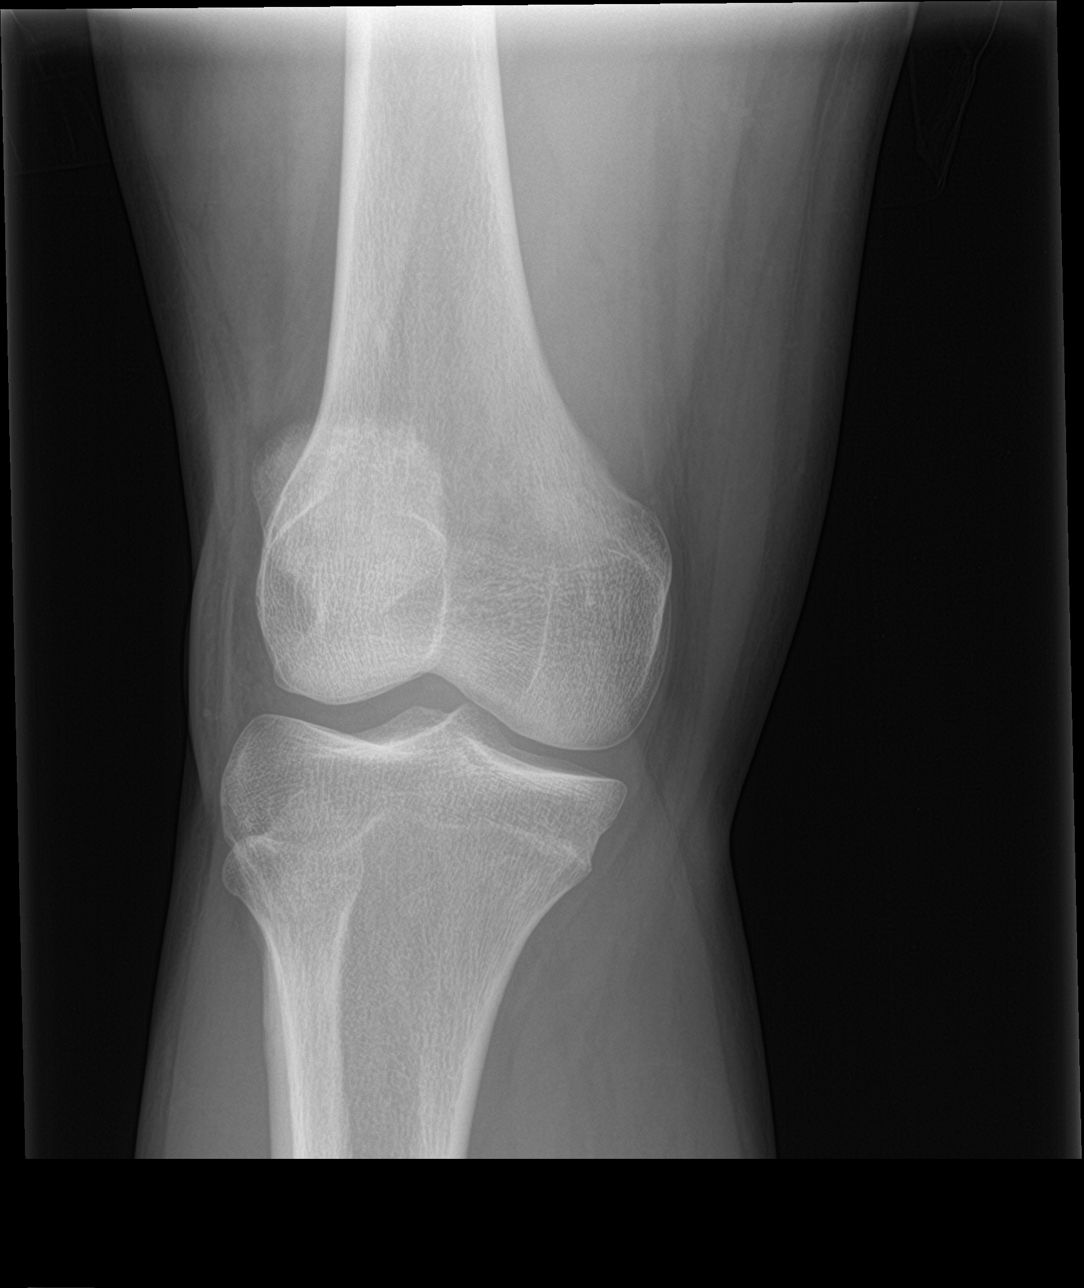

[tunnel]
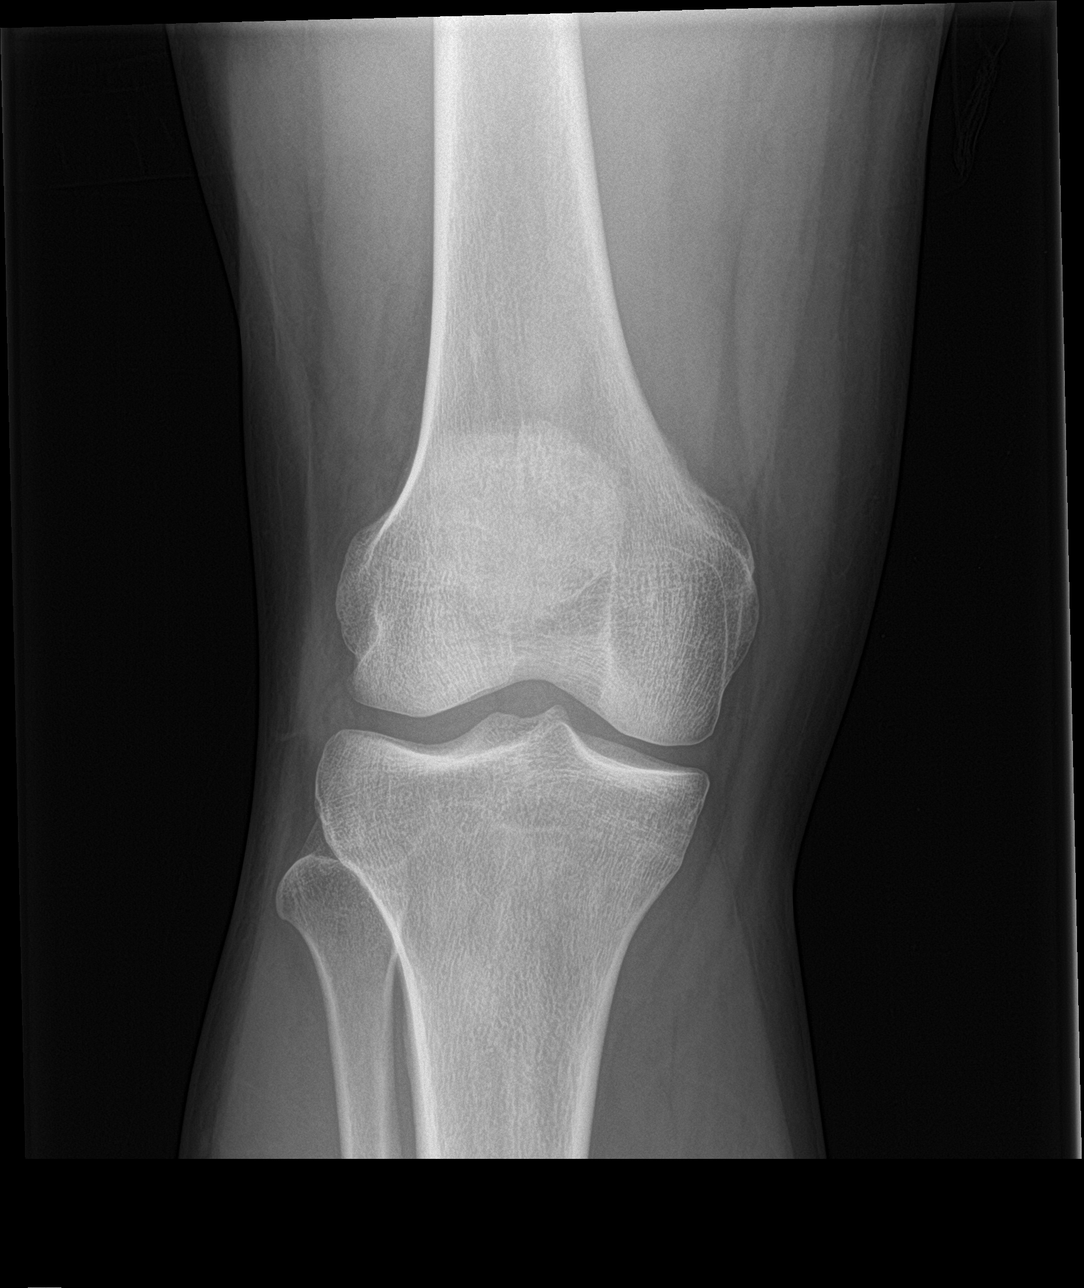

[knee lat]
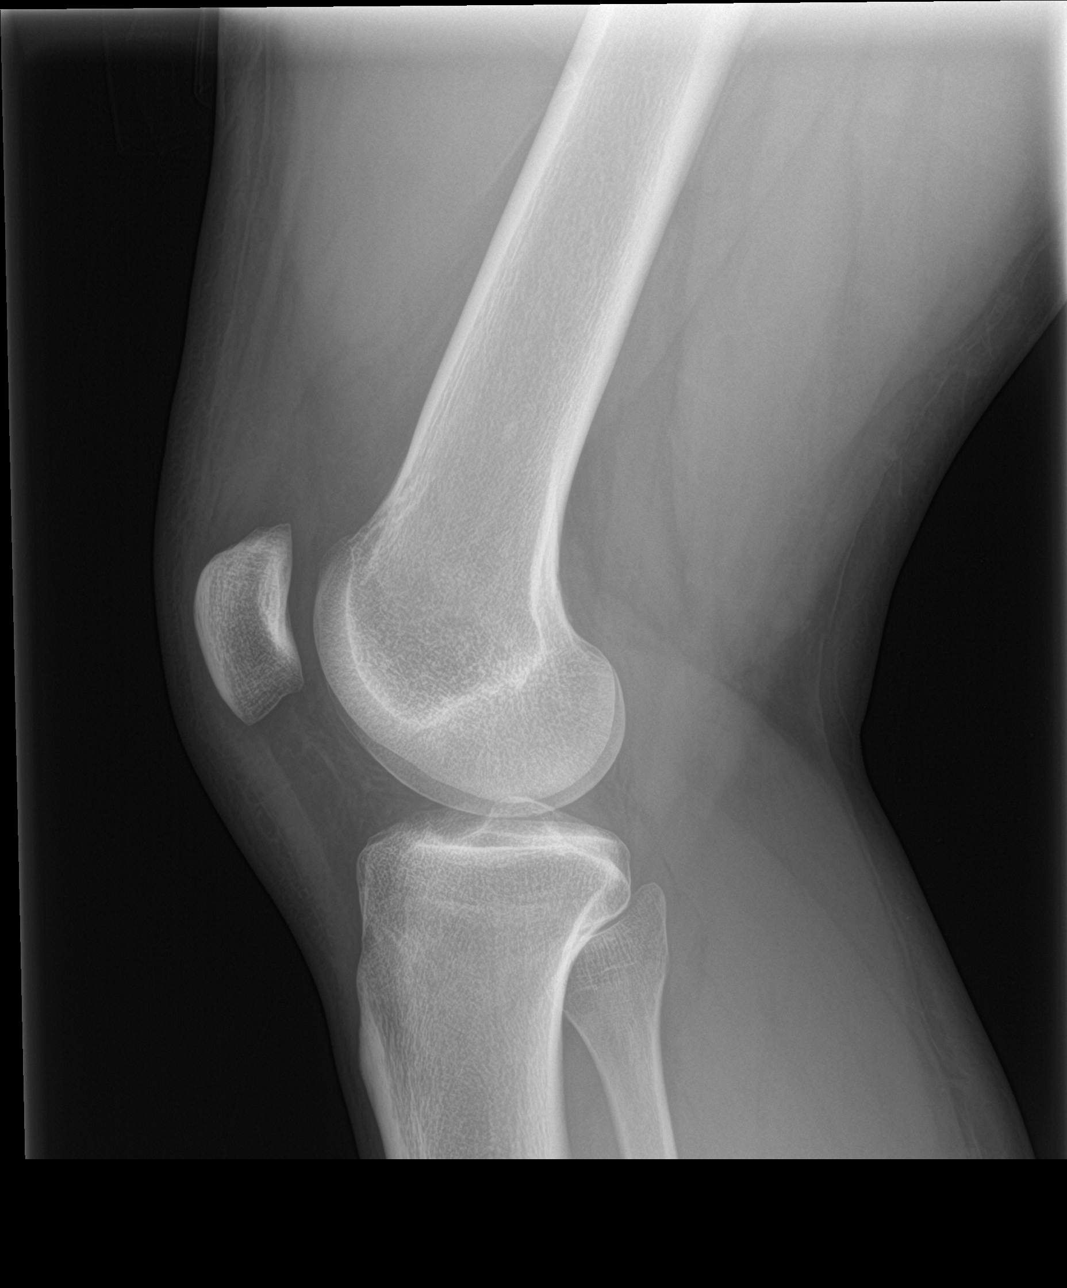

[knee sunrise]
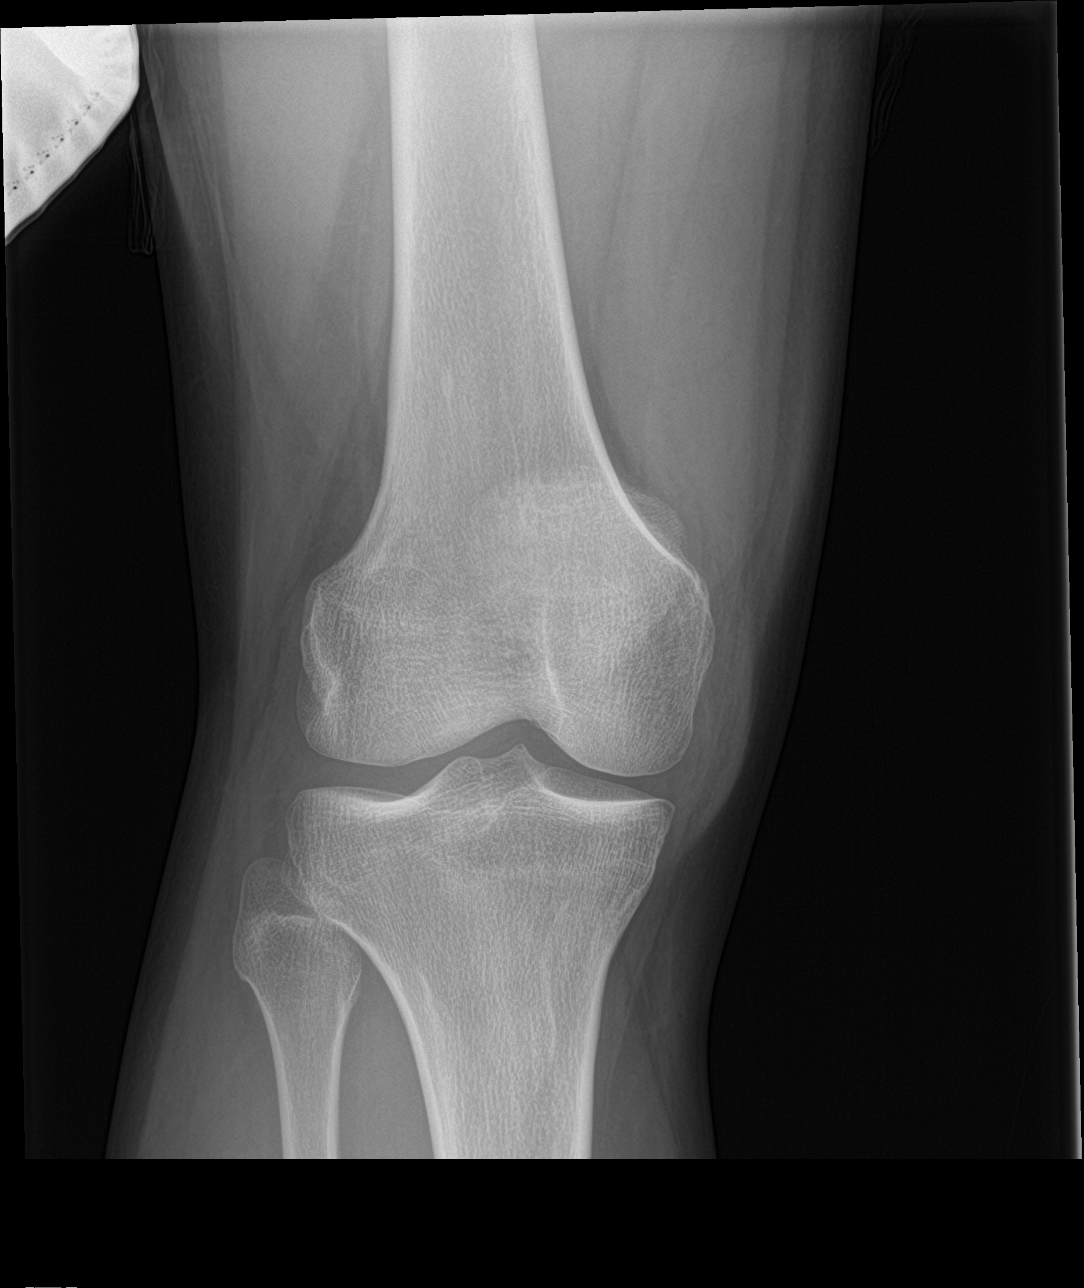

[4 of 4 positions shown; findings below may reference images not displayed]

FINDINGS: No evidence of fracture, dislocation, or joint effusion. No evidence
of arthropathy or other focal bone abnormality. Soft tissues are
unremarkable.
IMPRESSION: Negative.

## 2020-01-07 ENCOUNTER — Emergency Department (HOSPITAL_COMMUNITY): Payer: Self-pay

## 2020-01-07 ENCOUNTER — Emergency Department (HOSPITAL_COMMUNITY)
Admission: EM | Admit: 2020-01-07 | Discharge: 2020-01-07 | Disposition: A | Discharge status: Home / Self Care | Payer: Self-pay | Attending: Emergency Medicine | Admitting: Emergency Medicine

## 2020-01-07 ENCOUNTER — Encounter (HOSPITAL_COMMUNITY): Payer: Self-pay | Admitting: Emergency Medicine

## 2020-01-07 ENCOUNTER — Other Ambulatory Visit: Payer: Self-pay

## 2020-01-07 DIAGNOSIS — Z87891 Personal history of nicotine dependence: Secondary | ICD-10-CM | POA: Insufficient documentation

## 2020-01-07 DIAGNOSIS — F1210 Cannabis abuse, uncomplicated: Secondary | ICD-10-CM | POA: Insufficient documentation

## 2020-01-07 DIAGNOSIS — Y9389 Activity, other specified: Secondary | ICD-10-CM | POA: Insufficient documentation

## 2020-01-07 DIAGNOSIS — Y929 Unspecified place or not applicable: Secondary | ICD-10-CM | POA: Insufficient documentation

## 2020-01-07 DIAGNOSIS — W010XXA Fall on same level from slipping, tripping and stumbling without subsequent striking against object, initial encounter: Secondary | ICD-10-CM | POA: Insufficient documentation

## 2020-01-07 DIAGNOSIS — S62347A Nondisplaced fracture of base of fifth metacarpal bone. left hand, initial encounter for closed fracture: Secondary | ICD-10-CM | POA: Insufficient documentation

## 2020-01-07 DIAGNOSIS — Y998 Other external cause status: Secondary | ICD-10-CM | POA: Insufficient documentation

## 2020-01-07 MED ORDER — IBUPROFEN 600 MG PO TABS: 600.0000 mg | ORAL_TABLET | Freq: Four times a day (QID) | ORAL | 0 refills | Status: DC | PRN

## 2020-01-07 NOTE — ED Triage Notes (Addendum)
PT states he tried to catch himself from falling last night and landed on his left hand. PT c/o swelling and pain with ROM to left hand on arrival.

## 2020-01-07 NOTE — ED Provider Notes (Signed)
[facility] EMERGENCY DEPARTMENT Provider Note   CSN: [id number] Arrival date & time: 01/07/20  7989     History Chief Complaint  Patient presents with  . Hand Injury    Arthur Lee is a 24 y.o. male with no significant past medical history presenting with acute pain in his left hand after tripping and falling, catching himself with his outstretched left hand in his home.  He reports persistent pain and swelling, pain is worse with range of motion of the hand.  He is right-hand predominant but endorses is ambidextrous.  He denies weakness or numbness in his fingertips.  There is no radiation of pain.  He has applied ice packs and took tylenol pm last night which helped him sleep. Works in Set designer.    HPI     Past Medical History:  Diagnosis Date  . Seasonal allergies     Patient Active Problem List   Diagnosis Date Noted  . Abdominal pain, left upper quadrant 09/18/2013  . Headache(784.0) 07/11/2013  . Cough 07/02/2013  . Excessive cerumen in left ear canal 07/02/2013  . Sports physical 09/26/2012  . Allergic rhinitis 11/09/2011    History reviewed. No pertinent surgical history.     History reviewed. No pertinent family history.  Social History   Tobacco Use  . Smoking status: Former Smoker    Packs/day: 0.00    Types: Cigarettes  . Smokeless tobacco: Never Used  Substance Use Topics  . Alcohol use: No  . Drug use: Yes    Types: Marijuana    Comment: occasional    Home Medications Prior to Admission medications   Medication Sig Start Date End Date Taking? Authorizing Provider  cephALEXin (KEFLEX) 500 MG capsule Take 1 capsule (500 mg total) by mouth 4 (four) times daily. 12/30/15   Santiago Glad, PA-C  cetirizine (ZYRTEC) 10 MG tablet TAKE 1 TABLET (10 MG TOTAL) BY MOUTH DAILY. 07/29/14   Hess, Twana First, DO  fluticasone (FLONASE) 50 MCG/ACT nasal spray USE 2 SPRAYS IN EACH NOSTRIL EVERY DAY 07/29/14   Hess, Judie Grieve R, DO  hydrocortisone 2.5 %  cream Apply topically 3 (three) times daily. 11/10/13   Lowanda Foster, NP  ibuprofen (ADVIL) 600 MG tablet Take 1 tablet (600 mg total) by mouth every 6 (six) hours as needed. 01/07/20   Burgess Amor, PA-C  mupirocin ointment (BACTROBAN) 2 % Apply 1 application topically 3 (three) times daily. 11/10/13   Lowanda Foster, NP  polyethylene glycol powder (GLYCOLAX/MIRALAX) powder Take 17 g by mouth 2 (two) times daily as needed. 09/18/13   Uvaldo Rising, MD  PROAIR HFA 108 (90 BASE) MCG/ACT inhaler INHALE 2 PUFFS INTO THE LUNGS EVERY 6 (SIX) HOURS AS NEEDED FOR WHEEZING OR SHORTNESS OF BREATH. 07/29/14   Hess, Twana First, DO  ranitidine (ZANTAC) 150 MG tablet Take 1 tablet (150 mg total) by mouth 2 (two) times daily. 07/11/13   Losq, Leafy Kindle, MD    Allergies    Patient has no known allergies.  Review of Systems   Review of Systems  Constitutional: Negative for fever.  Musculoskeletal: Positive for arthralgias. Negative for joint swelling and myalgias.  Neurological: Negative for weakness and numbness.  All other systems reviewed and are negative.   Physical Exam Updated Vital Signs BP 96/69   Pulse 61   Temp 98.2 F (36.8 C) (Oral)   Resp 18   Ht 5\' 10"  (1.778 m)   Wt 74.8 kg   SpO2 97%   BMI  23.68 kg/m   Physical Exam Constitutional:      Appearance: He is well-developed.  HENT:     Head: Atraumatic.  Cardiovascular:     Comments: Pulses equal bilaterally Musculoskeletal:        General: Tenderness present.     Left hand: Swelling and bony tenderness present. No deformity. Normal range of motion. Normal sensation. There is no disruption of two-point discrimination. Normal capillary refill.     Cervical back: Normal range of motion.     Comments: Patient is point tender at his left proximal fifth metacarpal.  There is no palpable deformity.  Skin:    General: Skin is warm and dry.  Neurological:     Mental Status: He is alert.     Sensory: No sensory deficit.     Deep Tendon  Reflexes: Reflexes normal.     ED Results / Procedures / Treatments   Labs (all labs ordered are listed, but only abnormal results are displayed) Labs Reviewed - No data to display  EKG None  Radiology DG Hand Complete Left  Result Date: 01/07/2020 CLINICAL DATA:  Fall, swelling, pain EXAM: LEFT HAND - COMPLETE 3+ VIEW COMPARISON:  None. FINDINGS: There is a nondisplaced fracture through the base of the left 5th metacarpal. No subluxation or dislocation. Joint spaces maintained. Soft tissues are intact. IMPRESSION: Nondisplaced fracture through the base of the left 5th metacarpal. Electronically Signed   By: Rolm Baptise M.D.   On: 01/07/2020 09:33    Procedures Procedures (including critical care time)  SPLINT APPLICATION Date/Time: 95:09 AM Authorized by: Evalee Jefferson Consent: Verbal consent obtained. Risks and benefits: risks, benefits and alternatives were discussed Consent given by: patient Splint applied by: RN Location details: left hand Splint type: ulnar gutter Supplies used: splinting fiberglass, ace wrap, webril Post-procedure: The splinted body part was neurovascularly unchanged following the procedure. Patient tolerance: Patient tolerated the procedure well with no immediate complications.     Medications Ordered in ED Medications - No data to display  ED Course  I have reviewed the triage vital signs and the nursing notes.  Pertinent labs & imaging results that were available during my care of the patient were reviewed by me and considered in my medical decision making (see chart for details).    MDM Rules/Calculators/A&P                      Imaging was reviewed and discussed with patient.  He was placed in an ulnar gutter splint, sling also provided.  Discussed ice, elevation, ibuprofen for pain relief.  He was given referral to orthopedics for follow-up care. Final Clinical Impression(s) / ED Diagnoses Final diagnoses:  Closed nondisplaced fracture  of base of fifth metacarpal bone of left hand, initial encounter    Rx / DC Orders ED Discharge Orders         Ordered    ibuprofen (ADVIL) 600 MG tablet  Every 6 hours PRN     01/07/20 1005           Evalee Jefferson, PA-C 01/07/20 1025    Daleen Bo, MD 01/08/20 [phone]

## 2020-01-07 NOTE — Discharge Instructions (Signed)
Keep your splint clean, dry and on your hand at all times.  You may continue to use ice to help with pain and swelling.  Elevation will also help with pain and swelling.  Take the ibuprofen to help with pain relief symptoms as well.

## 2020-01-10 ENCOUNTER — Ambulatory Visit (INDEPENDENT_AMBULATORY_CARE_PROVIDER_SITE_OTHER): Payer: Self-pay | Admitting: Orthopaedic Surgery

## 2020-01-10 ENCOUNTER — Encounter: Payer: Self-pay | Admitting: Orthopaedic Surgery

## 2020-01-10 ENCOUNTER — Other Ambulatory Visit: Payer: Self-pay

## 2020-01-10 VITALS — BP 117/69 | HR 73 | Ht 71.0 in | Wt 153.0 lb

## 2020-01-10 DIAGNOSIS — W19XXXA Unspecified fall, initial encounter: Secondary | ICD-10-CM

## 2020-01-10 DIAGNOSIS — S62347A Nondisplaced fracture of base of fifth metacarpal bone. left hand, initial encounter for closed fracture: Secondary | ICD-10-CM

## 2020-01-10 DIAGNOSIS — S62346A Nondisplaced fracture of base of fifth metacarpal bone, right hand, initial encounter for closed fracture: Secondary | ICD-10-CM

## 2020-01-10 NOTE — Progress Notes (Signed)
Subjective:    Patient ID: Arthur Lee, male    DOB: August 29, 1995, 24 y.o.   MRN: [id number]  HPI He fell and hurt his left nondominant hand on 01-06-2020.  He had continued pain.  He went to the ER the next day and had x-rays which showed fracture at base of the fifth metacarpal on the left nondisplaced.  He was placed in a gutter splint.  He had no other injuries.  I have independently reviewed and interpreted x-rays of this patient done at another site by another physician or qualified health professional.  I have reviewed the ER records.   Review of Systems  Constitutional: Positive for activity change.  Musculoskeletal: Positive for arthralgias and joint swelling.  All other systems reviewed and are negative.  For Review of Systems, all other systems reviewed and are negative.  The following is a summary of the past history medically, past history surgically, known current medicines, social history and family history.  This information is gathered electronically by the computer from prior information and documentation.  I review this each visit and have found including this information at this point in the chart is beneficial and informative.   Past Medical History:  Diagnosis Date  . Seasonal allergies     History reviewed. No pertinent surgical history.  Current Outpatient Medications on File Prior to Visit  Medication Sig Dispense Refill  . PROAIR HFA 108 (90 BASE) MCG/ACT inhaler INHALE 2 PUFFS INTO THE LUNGS EVERY 6 (SIX) HOURS AS NEEDED FOR WHEEZING OR SHORTNESS OF BREATH. (Patient not taking: Reported on 01/10/2020) 8.5 each 11   No current facility-administered medications on file prior to visit.    Social History   Socioeconomic History  . Marital status: Single    Spouse name: Not on file  . Number of children: Not on file  . Years of education: Not on file  . Highest education level: Not on file  Occupational History  . Not on file  Tobacco Use  .  Smoking status: Former Smoker    Packs/day: 0.00    Types: Cigarettes  . Smokeless tobacco: Never Used  Substance and Sexual Activity  . Alcohol use: No  . Drug use: Yes    Types: Marijuana    Comment: occasional  . Sexual activity: Not Currently  Other Topics Concern  . Not on file  Social History Narrative  . Not on file   Social Determinants of Health   Financial Resource Strain:   . Difficulty of Paying Living Expenses:   Food Insecurity:   . Worried About Charity fundraiser in the Last Year:   . Arboriculturist in the Last Year:   Transportation Needs:   . Film/video editor (Medical):   Marland Kitchen Lack of Transportation (Non-Medical):   Physical Activity:   . Days of Exercise per Week:   . Minutes of Exercise per Session:   Stress:   . Feeling of Stress :   Social Connections:   . Frequency of Communication with Friends and Family:   . Frequency of Social Gatherings with Friends and Family:   . Attends Religious Services:   . Active Member of Clubs or Organizations:   . Attends Archivist Meetings:   Marland Kitchen Marital Status:   Intimate Partner Violence:   . Fear of Current or Ex-Partner:   . Emotionally Abused:   Marland Kitchen Physically Abused:   . Sexually Abused:     Family History  Problem  Relation Age of Onset  . Healthy Mother   . Healthy Father     BP 117/69   Pulse 73   Ht 5\' 11"  (1.803 m)   Wt 153 lb (69.4 kg)   BMI 21.34 kg/m   Body mass index is 21.34 kg/m.     Objective:   Physical Exam Vitals and nursing note reviewed.  Constitutional:      Appearance: He is well-developed.  HENT:     Head: Normocephalic and atraumatic.  Eyes:     Conjunctiva/sclera: Conjunctivae normal.     Pupils: Pupils are equal, round, and reactive to light.  Cardiovascular:     Rate and Rhythm: Normal rate and regular rhythm.  Pulmonary:     Effort: Pulmonary effort is normal.  Abdominal:     Palpations: Abdomen is soft.  Musculoskeletal:       Hands:      Cervical back: Normal range of motion and neck supple.  Skin:    General: Skin is warm and dry.  Neurological:     Mental Status: He is alert and oriented to person, place, and time.     Cranial Nerves: No cranial nerve deficit.     Motor: No abnormal muscle tone.     Coordination: Coordination normal.     Deep Tendon Reflexes: Reflexes are normal and symmetric. Reflexes normal.  Psychiatric:        Behavior: Behavior normal.        Thought Content: Thought content normal.        Judgment: Judgment normal.           Assessment & Plan:   Encounter Diagnosis  Name Primary?  . Closed nondisplaced fracture of base of fifth metacarpal bone of right hand, initial encounter Yes   Stay in the splint.  Keep it dry  Return in one week.  X-rays then out of plaster.  Consider [place] splint.  Call if any problem.  Precautions discussed.   Electronically Signed , MD 5/27/20218:32 AM

## 2020-01-10 NOTE — Patient Instructions (Signed)
Out of work note for today. Limited use of hand.

## 2020-01-17 ENCOUNTER — Encounter: Payer: Self-pay | Admitting: Orthopaedic Surgery

## 2020-01-17 ENCOUNTER — Ambulatory Visit: Payer: Self-pay | Admitting: Orthopaedic Surgery

## 2020-01-17 ENCOUNTER — Ambulatory Visit: Payer: Self-pay

## 2020-01-17 ENCOUNTER — Other Ambulatory Visit: Payer: Self-pay

## 2020-01-17 VITALS — BP 101/61 | HR 63 | Ht 70.0 in | Wt 157.2 lb

## 2020-01-17 DIAGNOSIS — S62347D Nondisplaced fracture of base of fifth metacarpal bone. left hand, subsequent encounter for fracture with routine healing: Secondary | ICD-10-CM

## 2020-01-17 DIAGNOSIS — S62346A Nondisplaced fracture of base of fifth metacarpal bone, right hand, initial encounter for closed fracture: Secondary | ICD-10-CM

## 2020-01-17 NOTE — Progress Notes (Signed)
My cast is heavy  His splint was removed.  NV intact.  ROM good.  No rotary changes.  X-rays were done of the left hand, reported separately.  Encounter Diagnosis  Name Primary?  . Closed nondisplaced fracture of base of fifth metacarpal bone of right hand, initial encounter Yes   A [place] splint applied. Instructions given.  Return in two weeks, X-rays then.  Call if any problem.  Precautions discussed.   Electronically Signed Darreld Mclean, MD 6/3/20219:50 AM   Stay out of work.

## 2020-01-17 NOTE — Patient Instructions (Signed)
OUT OF WORK ?

## 2020-01-31 ENCOUNTER — Encounter: Payer: Self-pay | Admitting: Orthopaedic Surgery

## 2020-01-31 ENCOUNTER — Ambulatory Visit: Payer: Self-pay

## 2020-01-31 ENCOUNTER — Other Ambulatory Visit: Payer: Self-pay

## 2020-01-31 ENCOUNTER — Ambulatory Visit (INDEPENDENT_AMBULATORY_CARE_PROVIDER_SITE_OTHER): Payer: Self-pay | Admitting: Orthopaedic Surgery

## 2020-01-31 DIAGNOSIS — S62347D Nondisplaced fracture of base of fifth metacarpal bone. left hand, subsequent encounter for fracture with routine healing: Secondary | ICD-10-CM

## 2020-01-31 DIAGNOSIS — S62367D Nondisplaced fracture of neck of fifth metacarpal bone, left hand, subsequent encounter for fracture with routine healing: Secondary | ICD-10-CM

## 2020-01-31 NOTE — Progress Notes (Signed)
My hand does not hurt  He has done well with the [facility] splint.  NV intact.  ROM full.  No rotary changes.  X-rays were done of the left hand, reported separately.  Encounter Diagnosis  Name Primary?  . Closed nondisplaced fracture of base of fifth metacarpal bone of left hand with routine healing, subsequent encounter Yes   He can stop the splint.  Return to work tomorrow.  Return in three weeks.  X-rays then.  Call if any problem.  Precautions discussed.   Electronically Signed Darreld Mclean, MD 6/17/20218:16 AM

## 2020-01-31 NOTE — Patient Instructions (Signed)
Go back to work tomorrow. 

## 2020-02-21 ENCOUNTER — Encounter: Payer: Self-pay | Admitting: Orthopaedic Surgery

## 2020-02-21 ENCOUNTER — Ambulatory Visit: Payer: Self-pay | Admitting: Orthopaedic Surgery

## 2020-02-22 ENCOUNTER — Encounter: Payer: Self-pay | Admitting: Orthopaedic Surgery

## 2022-10-14 ENCOUNTER — Encounter: Payer: Self-pay | Admitting: Radiology

## 2022-12-27 ENCOUNTER — Ambulatory Visit
Admission: RE | Admit: 2022-12-27 | Discharge: 2022-12-27 | Disposition: A | Discharge status: Home / Self Care | Payer: Self-pay | Source: Ambulatory Visit | Attending: Nurse Practitioner | Admitting: Nurse Practitioner

## 2022-12-27 VITALS — BP 111/63 | HR 76 | Temp 98.7°F | Resp 16

## 2022-12-27 DIAGNOSIS — Z113 Encounter for screening for infections with a predominantly sexual mode of transmission: Secondary | ICD-10-CM | POA: Diagnosis not present

## 2022-12-27 NOTE — Discharge Instructions (Addendum)
The clinic will contact you with the results of the testing done today  Follow up as needed

## 2022-12-27 NOTE — ED Triage Notes (Signed)
Pt presents to UC stating he would like to be set up with a PCP. Denies symptoms.

## 2022-12-27 NOTE — ED Provider Notes (Signed)
[facility]    CSN: [id number] Arrival date & time: 12/27/22  1610      History   Chief Complaint Chief Complaint  Patient presents with   SEXUALLY TRANSMITTED DISEASE    HPI Arthur Lee is a 27 y.o. male presents for STD screening.  Patient denies any known exposure.  Denies any penile discharge, dysuria, testicular pain or swelling.  Would also like to be set up with a PCP for health maintenance.  No other concerns at this time.  HPI  Past Medical History:  Diagnosis Date   Seasonal allergies     Patient Active Problem List   Diagnosis Date Noted   Abdominal pain, left upper quadrant 09/18/2013   Headache(784.0) 07/11/2013   Cough 07/02/2013   Excessive cerumen in left ear canal 07/02/2013   Sports physical 09/26/2012   Allergic rhinitis 11/09/2011    History reviewed. No pertinent surgical history.     Home Medications    Prior to Admission medications   Medication Sig Start Date End Date Taking? Authorizing Provider  PROAIR HFA 108 (90 BASE) MCG/ACT inhaler INHALE 2 PUFFS INTO THE LUNGS EVERY 6 (SIX) HOURS AS NEEDED FOR WHEEZING OR SHORTNESS OF BREATH. 07/29/14   Hess, Twana First, DO    Family History Family History  Problem Relation Age of Onset   Healthy Mother    Healthy Father     Social History Social History   Tobacco Use   Smoking status: Some Days    Types: Cigars   Smokeless tobacco: Never  Vaping Use   Vaping Use: Former  Substance Use Topics   Alcohol use: No   Drug use: Not Currently    Types: Marijuana    Comment: occasional     Allergies   Patient has no known allergies.   Review of Systems Review of Systems  Genitourinary:        STD screening     Physical Exam Triage Vital Signs ED Triage Vitals  Enc Vitals Group     BP 12/27/22 1621 111/63     Pulse Rate 12/27/22 1621 76     Resp 12/27/22 1621 16     Temp 12/27/22 1621 98.7 F (37.1 C)     Temp Source 12/27/22 1621 Oral     SpO2 12/27/22 1621  96 %     Weight --      Height --      Head Circumference --      Peak Flow --      Pain Score 12/27/22 1618 0     Pain Loc --      Pain Edu? --      Excl. in GC? --    No data found.  Updated Vital Signs BP 111/63 (BP Location: Right Arm)   Pulse 76   Temp 98.7 F (37.1 C) (Oral)   Resp 16   SpO2 96%   Visual Acuity Right Eye Distance:   Left Eye Distance:   Bilateral Distance:    Right Eye Near:   Left Eye Near:    Bilateral Near:     Physical Exam Vitals and nursing note reviewed.  Constitutional:      Appearance: Normal appearance.  HENT:     Head: Normocephalic and atraumatic.  Eyes:     Pupils: Pupils are equal, round, and reactive to light.  Cardiovascular:     Rate and Rhythm: Normal rate.  Pulmonary:     Effort: Pulmonary effort is normal.  Skin:    General: Skin is warm and dry.  Neurological:     General: No focal deficit present.     Mental Status: He is alert and oriented to person, place, and time.  Psychiatric:        Mood and Affect: Mood normal.        Behavior: Behavior normal.      UC Treatments / Results  Labs (all labs ordered are listed, but only abnormal results are displayed) Labs Reviewed - No data to display  EKG   Radiology No results found.  Procedures Procedures (including critical care time)  Medications Ordered in UC Medications - No data to display  Initial Impression / Assessment and Plan / UC Course  I have reviewed the triage vital signs and the nursing notes.  Pertinent labs & imaging results that were available during my care of the patient were reviewed by me and considered in my medical decision making (see chart for details).     STD testing as ordered. Pt declined blood work Will contact with any positive results Nursing staff was able to make pt an appt for a PCP Patient to follow up as needed Final Clinical Impressions(s) / UC Diagnoses   Final diagnoses:  Screening examination for STD  (sexually transmitted disease)   Discharge Instructions   None    ED Prescriptions   None    PDMP not reviewed this encounter.   Radford Pax, NP 12/27/22 [phone]

## 2022-12-28 LAB — CYTOLOGY, (ORAL, ANAL, URETHRAL) ANCILLARY ONLY
Chlamydia: NEGATIVE
Comment: NEGATIVE
Comment: NEGATIVE
Comment: NORMAL
Neisseria Gonorrhea: NEGATIVE
Trichomonas: NEGATIVE

## 2023-01-13 ENCOUNTER — Ambulatory Visit: Payer: Self-pay | Admitting: Medical
